# Patient Record
Sex: Female | Born: 1990 | Race: White | Hispanic: No | Marital: Single | Smoking: Current every day smoker
Health system: Southern US, Community
[De-identification: ages and names within clinical notes are randomized; demographics above are authoritative.]

## PROBLEM LIST (undated history)

## (undated) DIAGNOSIS — F419 Anxiety disorder, unspecified: Secondary | ICD-10-CM

## (undated) DIAGNOSIS — F329 Major depressive disorder, single episode, unspecified: Secondary | ICD-10-CM

## (undated) DIAGNOSIS — F32A Depression, unspecified: Secondary | ICD-10-CM

## (undated) DIAGNOSIS — F1120 Opioid dependence, uncomplicated: Secondary | ICD-10-CM

## (undated) HISTORY — PX: TONSILLECTOMY: SUR1361

## (undated) HISTORY — PX: TYMPANOSTOMY TUBE PLACEMENT: SHX32

## (undated) HISTORY — DX: Major depressive disorder, single episode, unspecified: F32.9

## (undated) HISTORY — DX: Anxiety disorder, unspecified: F41.9

## (undated) HISTORY — DX: Depression, unspecified: F32.A

## (undated) HISTORY — DX: Opioid dependence, uncomplicated: F11.20

---
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
## 2015-04-29 ENCOUNTER — Inpatient Hospital Stay (HOSPITAL_COMMUNITY)
Admission: AD | Admit: 2015-04-29 | Discharge: 2015-04-29 | Disposition: A | Discharge status: Home / Self Care | Payer: BLUE CROSS/BLUE SHIELD | Source: Ambulatory Visit | Attending: Obstetrics and Gynecology | Admitting: Obstetrics and Gynecology

## 2015-04-29 DIAGNOSIS — Z3201 Encounter for pregnancy test, result positive: Secondary | ICD-10-CM | POA: Insufficient documentation

## 2015-04-29 DIAGNOSIS — F1120 Opioid dependence, uncomplicated: Secondary | ICD-10-CM

## 2015-04-29 DIAGNOSIS — Z3200 Encounter for pregnancy test, result unknown: Secondary | ICD-10-CM | POA: Diagnosis present

## 2015-04-29 LAB — URINALYSIS, ROUTINE W REFLEX MICROSCOPIC
BILIRUBIN URINE: NEGATIVE
Glucose, UA: NEGATIVE mg/dL
HGB URINE DIPSTICK: NEGATIVE
KETONES UR: NEGATIVE mg/dL
Leukocytes, UA: NEGATIVE
NITRITE: NEGATIVE
PH: 7.5 (ref 5.0–8.0)
Protein, ur: NEGATIVE mg/dL
Specific Gravity, Urine: 1.015 (ref 1.005–1.030)
UROBILINOGEN UA: 0.2 mg/dL (ref 0.0–1.0)

## 2015-04-29 LAB — POCT PREGNANCY, URINE: Preg Test, Ur: POSITIVE — AB

## 2015-04-29 NOTE — MAU Note (Signed)
Pt states about a year ago she was put on Subutex for opiate addiction, stopped it in January. Pt reports she has been using opiates again for the last 1.5 months and wants help to stop drugs.

## 2015-04-29 NOTE — MAU Provider Note (Signed)
  History     CSN: [id number]  Arrival date and time: 04/29/15 [id number]   First Provider Initiated Contact with Patient 04/29/15 0335      No chief complaint on file.  HPI  Theresa Curtis is a 24 y.o. G1P0 at unknown EGA. She presents today for pregnancy verification, and questions related to opiate dependence. She states that she had a +UPT at home about two weeks ago. She denies any abdominal pain or vaginal bleeding at this time. She is uncertain of her LMP and states that she always has very irregular periods. She would like to know how far along she is today. She also has specific questions related to opiate withdrawal in early pregnancy. She states that she was on Suboxone, but stopped it in January. About two months ago she started using opiate pain medications again. She states that she has been using "oxys and hydros". She states that "she is not using very much". She would like to stop at this time, but does not wish to seek counseling or going back on suboxone. [facility]   No past medical history on file.  No past surgical history on file.  No family history on file.  History  Substance Use Topics  . Smoking status: Not on file  . Smokeless tobacco: Not on file  . Alcohol Use: Not on file    Allergies: Allergies not on file  No prescriptions prior to admission    Review of Systems  Gastrointestinal: Negative for nausea, vomiting, abdominal pain, diarrhea and constipation.   Physical Exam   Blood pressure 138/72, pulse 124, temperature 99.4 F (37.4 C), temperature source Oral, resp. rate 16, height 5\' 4"  (1.626 m), weight 95.709 kg (211 lb), SpO2 100 %.  Physical Exam  Nursing note and vitals reviewed. Constitutional: She is oriented to person, place, and time. She appears well-developed and well-nourished. No distress.  HENT:  Head: Normocephalic.  Respiratory: Effort normal.  GI: Soft.  Neurological: She  is alert and oriented to person, place, and time.  Skin: Skin is warm and dry.  Psychiatric: She has a normal mood and affect.    MAU Course  Procedures  MDM D/W the patient at length about opiate withdrawal. Questions answered. She states that she would like an US today to know how far along she is. Explained that we don't typically do dating US in ER. Offered to schedule patient for outpatient US. She declines that at this time. She would prefer to wait until she has her new OB appointment. Offered information on NA and other counseling.   Assessment and Plan   1. Encounter for pregnancy test, result positive   2. Uncomplicated opioid dependence     **Patient does not want to have any of this information discussed with any family members present**    DC home Comfort measures reviewed  1st Trimester precautions  RX: none  Return to MAU as needed FU with OB as planned  Follow-up Information    Follow up with Almon HerculesOSS,KENDRA H., MD.   Specialty:  Obstetrics and Gynecology   Why:  As scheduled   Contact information:   [address] [place] [address] [phone]         Tawnya CrookHogan, Christobal Morado Donovan 04/29/2015, 3:41 AM

## 2015-04-29 NOTE — Discharge Instructions (Signed)
Opioid Use Disorder °Opioid use disorder is a mental disorder. It is the continued nonmedical use of opioids in spite of risks to health and well-being. Misused opioids include the street drug heroin. They also include pain medicines such as morphine, hydrocodone, oxycodone, and fentanyl. Opioids are very addictive. People who misuse opioids get an exaggerated feeling of well-being. Opioid use disorder often disrupts activities at home, work, or school. It may cause mental or physical problems.  °A family history of opioid use disorder puts you at higher risk of it. People with opioid use disorder often misuse other drugs or have mental illness such as depression, posttraumatic stress disorder, or antisocial personality disorder. They also are at risk of suicide and death from overdose. °SIGNS AND SYMPTOMS  °Signs and symptoms of opioid use disorder include: °· Use of opioids in larger amounts or over a longer period than intended. °· Unsuccessful attempts to cut down or control opioid use. °· A lot of time spent obtaining, using, or recovering from the effects of opioids. °· A strong desire or urge to use opioids (craving). °· Continued use of opioids in spite of major problems at work, school, or home because of use. °· Continued use of opioids in spite of relationship problems because of use. °· Giving up or cutting down on important life activities because of opioid use. °· Use of opioids over and over in situations when it is physically hazardous, such as driving a car. °· Continued use of opioids in spite of a physical problem that is likely related to use. Physical problems can include: °¨ Severe constipation. °¨ Poor nutrition. °¨ Infertility. °¨ Tuberculosis. °¨ Aspiration pneumonia. °¨ Infections such as human immunodeficiency virus (HIV) and hepatitis (from injecting opioids). °· Continued use of opioids in spite of a mental problem that is likely related to use. Mental problems can  include: °¨ Depression. °¨ Anxiety. °¨ Hallucinations. °¨ Sleep problems. °¨ Loss of sexual function. °· Need to use more and more opioids to get the same effect, or lessened effect over time with use of the same amount (tolerance). °· Having withdrawal symptoms when opioid use is stopped, or using opioids to reduce or avoid withdrawal symptoms. Withdrawal symptoms include: °¨ Depressed, anxious, or irritable mood. °¨ Nausea, vomiting, diarrhea, or intestinal cramping. °¨ Muscle aches or spasms. °¨ Excessive tearing or runny nose. °¨ Dilated pupils, sweating, or hairs standing on end. °¨ Yawning. °¨ Fever, raised blood pressure, or fast pulse. °¨ Restlessness or trouble sleeping. This does not apply to people taking opioids for medical reasons only. °DIAGNOSIS °Opioid use disorder is diagnosed by your health care provider. You may be asked questions about your opioid use and and how it affects your life. A physical exam may be done. A drug screen may be ordered. You may be referred to a mental health professional. The diagnosis of opioid use disorder requires at least two symptoms within 12 months. The type of opioid use disorder you have depends on the number of signs and symptoms you have. The type may be: °· Mild. Two or three signs and symptoms.    °· Moderate. Four or five signs and symptoms.   °· Severe. Six or more signs and symptoms. °TREATMENT  °Treatment is usually provided by mental health professionals with training in substance use disorders. The following options are available: °· Detoxification. This is the first step in treatment for withdrawal. It is medically supervised withdrawal with the use of medicines. These medicines lessen withdrawal symptoms. They also raise the chance   of becoming opioid free. °· Counseling, also known as talk therapy. Talk therapy addresses the reasons you use opioids. It also addresses ways to keep you from using again (relapse). The goals of talk therapy are to avoid  relapse by: °¨ Identifying and avoiding triggers for use. °¨ Finding healthy ways to cope with stress. °¨ Learning how to handle cravings. °· Support groups. Support groups provide emotional support, advice, and guidance. °· A medicine that blocks opioid receptors in your brain. This medicine can reduce opioid cravings that lead to relapse. This medicine also blocks the desired opioid effect when relapse occurs. °· Opioids that are taken by mouth in place of the misused opioid (opioid maintenance treatment). These medicines satisfy cravings but are safer than commonly misused opioids. This often is the best option for people who continue to relapse with other treatments. °HOME CARE INSTRUCTIONS  °· Take medicines only as directed by your health care provider. °· Check with your health care provider before starting new medicines. °· Keep all follow-up visits as directed by your health care provider. °SEEK MEDICAL CARE IF: °· You are not able to take your medicines as directed. °· Your symptoms get worse. °SEEK IMMEDIATE MEDICAL CARE IF: °· You have serious thoughts about hurting yourself or others. °· You may have taken an overdose of opioids. °FOR MORE INFORMATION °· National Institute on Drug Abuse: www.drugabuse.gov °· Substance Abuse and Mental Health Services Administration: www.samhsa.gov °Document Released: 07/30/2007 Document Revised: 02/16/2014 Document Reviewed: 10/15/2013 °ExitCare® Patient Information ©2015 [employer]. This information is not intended to replace advice given to you by your health care provider. Make sure you discuss any questions you have with your health care provider. ° °

## 2015-05-28 LAB — OB RESULTS CONSOLE ABO/RH: RH Type: POSITIVE

## 2015-05-28 LAB — OB RESULTS CONSOLE RUBELLA ANTIBODY, IGM: Rubella: IMMUNE

## 2015-05-28 LAB — OB RESULTS CONSOLE HIV ANTIBODY (ROUTINE TESTING): HIV: NONREACTIVE

## 2015-05-28 LAB — OB RESULTS CONSOLE HEPATITIS B SURFACE ANTIGEN: Hepatitis B Surface Ag: NEGATIVE

## 2015-05-28 LAB — OB RESULTS CONSOLE ANTIBODY SCREEN: Antibody Screen: NEGATIVE

## 2015-05-28 LAB — OB RESULTS CONSOLE GC/CHLAMYDIA
CHLAMYDIA, DNA PROBE: NEGATIVE
Gonorrhea: NEGATIVE

## 2015-05-28 LAB — OB RESULTS CONSOLE RPR: RPR: NONREACTIVE

## 2015-12-24 ENCOUNTER — Telehealth (HOSPITAL_COMMUNITY): Payer: Self-pay | Admitting: *Deleted

## 2015-12-24 ENCOUNTER — Encounter (HOSPITAL_COMMUNITY): Payer: Self-pay | Admitting: *Deleted

## 2015-12-24 LAB — OB RESULTS CONSOLE GBS: STREP GROUP B AG: NEGATIVE

## 2015-12-24 NOTE — Telephone Encounter (Signed)
Preadmission screen  

## 2015-12-27 ENCOUNTER — Encounter (HOSPITAL_COMMUNITY): Payer: Self-pay | Admitting: *Deleted

## 2015-12-27 ENCOUNTER — Inpatient Hospital Stay (HOSPITAL_COMMUNITY): Payer: BLUE CROSS/BLUE SHIELD | Admitting: Anesthesiology

## 2015-12-27 ENCOUNTER — Inpatient Hospital Stay (HOSPITAL_COMMUNITY)
Admission: AD | Admit: 2015-12-27 | Discharge: 2015-12-29 | DRG: 775 | Disposition: A | Discharge status: Home / Self Care | Payer: BLUE CROSS/BLUE SHIELD | Source: Ambulatory Visit | Attending: Obstetrics and Gynecology | Admitting: Obstetrics and Gynecology

## 2015-12-27 ENCOUNTER — Inpatient Hospital Stay (HOSPITAL_COMMUNITY): Admission: RE | Admit: 2015-12-27 | Payer: BLUE CROSS/BLUE SHIELD | Source: Ambulatory Visit

## 2015-12-27 DIAGNOSIS — Z833 Family history of diabetes mellitus: Secondary | ICD-10-CM

## 2015-12-27 DIAGNOSIS — F17210 Nicotine dependence, cigarettes, uncomplicated: Secondary | ICD-10-CM | POA: Diagnosis present

## 2015-12-27 DIAGNOSIS — O99214 Obesity complicating childbirth: Secondary | ICD-10-CM | POA: Diagnosis present

## 2015-12-27 DIAGNOSIS — O4292 Full-term premature rupture of membranes, unspecified as to length of time between rupture and onset of labor: Secondary | ICD-10-CM | POA: Diagnosis present

## 2015-12-27 DIAGNOSIS — Z6841 Body Mass Index (BMI) 40.0 and over, adult: Secondary | ICD-10-CM

## 2015-12-27 DIAGNOSIS — O99334 Smoking (tobacco) complicating childbirth: Secondary | ICD-10-CM | POA: Diagnosis present

## 2015-12-27 DIAGNOSIS — Z8249 Family history of ischemic heart disease and other diseases of the circulatory system: Secondary | ICD-10-CM | POA: Diagnosis not present

## 2015-12-27 DIAGNOSIS — Z3A39 39 weeks gestation of pregnancy: Secondary | ICD-10-CM

## 2015-12-27 DIAGNOSIS — Z823 Family history of stroke: Secondary | ICD-10-CM | POA: Diagnosis not present

## 2015-12-27 DIAGNOSIS — Z3490 Encounter for supervision of normal pregnancy, unspecified, unspecified trimester: Secondary | ICD-10-CM

## 2015-12-27 LAB — URINALYSIS, ROUTINE W REFLEX MICROSCOPIC
Bilirubin Urine: NEGATIVE
GLUCOSE, UA: NEGATIVE mg/dL
HGB URINE DIPSTICK: NEGATIVE
Ketones, ur: NEGATIVE mg/dL
LEUKOCYTES UA: NEGATIVE
Nitrite: NEGATIVE
PH: 6 (ref 5.0–8.0)
PROTEIN: NEGATIVE mg/dL
SPECIFIC GRAVITY, URINE: 1.015 (ref 1.005–1.030)

## 2015-12-27 LAB — CBC
HEMATOCRIT: 37.1 % (ref 36.0–46.0)
HEMOGLOBIN: 12.8 g/dL (ref 12.0–15.0)
MCH: 29.6 pg (ref 26.0–34.0)
MCHC: 34.5 g/dL (ref 30.0–36.0)
MCV: 85.7 fL (ref 78.0–100.0)
Platelets: 226 10*3/uL (ref 150–400)
RBC: 4.33 MIL/uL (ref 3.87–5.11)
RDW: 15.1 % (ref 11.5–15.5)
WBC: 15.1 10*3/uL — ABNORMAL HIGH (ref 4.0–10.5)

## 2015-12-27 LAB — RPR: RPR Ser Ql: NONREACTIVE

## 2015-12-27 LAB — TYPE AND SCREEN
ABO/RH(D): A POS
Antibody Screen: NEGATIVE

## 2015-12-27 LAB — ABO/RH: ABO/RH(D): A POS

## 2015-12-27 LAB — POCT FERN TEST: POCT Fern Test: POSITIVE

## 2015-12-27 MED ORDER — MEASLES, MUMPS & RUBELLA VAC ~~LOC~~ INJ
0.5000 mL | INJECTION | Freq: Once | SUBCUTANEOUS | Status: DC
  Filled 2015-12-27: qty 0.5

## 2015-12-27 MED ORDER — SENNOSIDES-DOCUSATE SODIUM 8.6-50 MG PO TABS
2.0000 | ORAL_TABLET | ORAL | Status: DC
  Administered 2015-12-27 – 2015-12-28 (×2): 2 via ORAL
  Filled 2015-12-27 (×2): qty 2

## 2015-12-27 MED ORDER — OXYTOCIN 10 UNIT/ML IJ SOLN
1.0000 m[IU]/min | INTRAVENOUS | Status: DC
  Administered 2015-12-27: 1 m[IU]/min via INTRAVENOUS
  Administered 2015-12-27: 4 m[IU]/min via INTRAVENOUS
  Filled 2015-12-27: qty 10

## 2015-12-27 MED ORDER — EPHEDRINE 5 MG/ML INJ
10.0000 mg | INTRAVENOUS | Status: DC | PRN
  Filled 2015-12-27: qty 2

## 2015-12-27 MED ORDER — PHENYLEPHRINE 40 MCG/ML (10ML) SYRINGE FOR IV PUSH (FOR BLOOD PRESSURE SUPPORT)
80.0000 ug | PREFILLED_SYRINGE | INTRAVENOUS | Status: DC | PRN
  Filled 2015-12-27: qty 2
  Filled 2015-12-27: qty 20

## 2015-12-27 MED ORDER — LIDOCAINE HCL (PF) 1 % IJ SOLN
INTRAMUSCULAR | Status: DC | PRN
  Administered 2015-12-27: 3 mL via EPIDURAL
  Administered 2015-12-27: 5 mL via EPIDURAL
  Administered 2015-12-27: 2 mL via EPIDURAL

## 2015-12-27 MED ORDER — WITCH HAZEL-GLYCERIN EX PADS
1.0000 "application " | MEDICATED_PAD | CUTANEOUS | Status: DC | PRN
  Administered 2015-12-28: 1 via TOPICAL

## 2015-12-27 MED ORDER — SIMETHICONE 80 MG PO CHEW: 80.0000 mg | CHEWABLE_TABLET | ORAL | Status: DC | PRN

## 2015-12-27 MED ORDER — ONDANSETRON HCL 4 MG/2ML IJ SOLN: 4.0000 mg | Freq: Four times a day (QID) | INTRAMUSCULAR | Status: DC | PRN

## 2015-12-27 MED ORDER — ONDANSETRON HCL 4 MG PO TABS: 4.0000 mg | ORAL_TABLET | ORAL | Status: DC | PRN

## 2015-12-27 MED ORDER — TETANUS-DIPHTH-ACELL PERTUSSIS 5-2.5-18.5 LF-MCG/0.5 IM SUSP
0.5000 mL | Freq: Once | INTRAMUSCULAR | Status: DC
Start: 2015-12-28 — End: 2015-12-28

## 2015-12-27 MED ORDER — LACTATED RINGERS IV SOLN: 500.0000 mL | Freq: Once | INTRAVENOUS | Status: DC

## 2015-12-27 MED ORDER — LIDOCAINE HCL (PF) 1 % IJ SOLN
30.0000 mL | INTRAMUSCULAR | Status: DC | PRN
  Filled 2015-12-27: qty 30

## 2015-12-27 MED ORDER — FLEET ENEMA 7-19 GM/118ML RE ENEM: 1.0000 | ENEMA | RECTAL | Status: DC | PRN

## 2015-12-27 MED ORDER — OXYCODONE-ACETAMINOPHEN 5-325 MG PO TABS
1.0000 | ORAL_TABLET | ORAL | Status: DC | PRN
  Administered 2015-12-28 (×2): 1 via ORAL
  Filled 2015-12-27 (×2): qty 1

## 2015-12-27 MED ORDER — TERBUTALINE SULFATE 1 MG/ML IJ SOLN
0.2500 mg | Freq: Once | INTRAMUSCULAR | Status: DC | PRN
  Filled 2015-12-27: qty 1

## 2015-12-27 MED ORDER — DIPHENHYDRAMINE HCL 50 MG/ML IJ SOLN
12.5000 mg | INTRAMUSCULAR | Status: DC | PRN
Start: 2015-12-27 — End: 2015-12-27

## 2015-12-27 MED ORDER — ACETAMINOPHEN 325 MG PO TABS: 650.0000 mg | ORAL_TABLET | ORAL | Status: DC | PRN

## 2015-12-27 MED ORDER — IBUPROFEN 600 MG PO TABS
600.0000 mg | ORAL_TABLET | Freq: Four times a day (QID) | ORAL | Status: DC
Start: 2015-12-27 — End: 2015-12-29
  Administered 2015-12-27 – 2015-12-29 (×7): 600 mg via ORAL
  Filled 2015-12-27 (×8): qty 1

## 2015-12-27 MED ORDER — OXYCODONE-ACETAMINOPHEN 5-325 MG PO TABS
1.0000 | ORAL_TABLET | ORAL | Status: DC | PRN
  Administered 2015-12-27: 1 via ORAL
  Filled 2015-12-27: qty 1

## 2015-12-27 MED ORDER — PRENATAL MULTIVITAMIN CH
1.0000 | ORAL_TABLET | Freq: Every day | ORAL | Status: DC
  Administered 2015-12-28 – 2015-12-29 (×2): 1 via ORAL
  Filled 2015-12-27 (×2): qty 1

## 2015-12-27 MED ORDER — MEDROXYPROGESTERONE ACETATE 150 MG/ML IM SUSP: 150.0000 mg | INTRAMUSCULAR | Status: DC | PRN

## 2015-12-27 MED ORDER — PHENYLEPHRINE 40 MCG/ML (10ML) SYRINGE FOR IV PUSH (FOR BLOOD PRESSURE SUPPORT)
80.0000 ug | PREFILLED_SYRINGE | INTRAVENOUS | Status: DC | PRN
  Filled 2015-12-27: qty 2

## 2015-12-27 MED ORDER — FENTANYL 2.5 MCG/ML BUPIVACAINE 1/10 % EPIDURAL INFUSION (WH - ANES)
14.0000 mL/h | INTRAMUSCULAR | Status: DC | PRN
  Administered 2015-12-27 (×2): 14 mL/h via EPIDURAL
  Filled 2015-12-27 (×2): qty 125

## 2015-12-27 MED ORDER — LACTATED RINGERS IV SOLN
500.0000 mL | INTRAVENOUS | Status: DC | PRN
  Administered 2015-12-27: 500 mL via INTRAVENOUS

## 2015-12-27 MED ORDER — DIPHENHYDRAMINE HCL 25 MG PO CAPS: 25.0000 mg | ORAL_CAPSULE | Freq: Four times a day (QID) | ORAL | Status: DC | PRN

## 2015-12-27 MED ORDER — OXYCODONE-ACETAMINOPHEN 5-325 MG PO TABS: 2.0000 | ORAL_TABLET | ORAL | Status: DC | PRN

## 2015-12-27 MED ORDER — BENZOCAINE-MENTHOL 20-0.5 % EX AERO
1.0000 "application " | INHALATION_SPRAY | CUTANEOUS | Status: DC | PRN
  Administered 2015-12-27: 1 via TOPICAL
  Filled 2015-12-27: qty 56

## 2015-12-27 MED ORDER — ONDANSETRON HCL 4 MG/2ML IJ SOLN: 4.0000 mg | INTRAMUSCULAR | Status: DC | PRN

## 2015-12-27 MED ORDER — OXYTOCIN BOLUS FROM INFUSION: 500.0000 mL | INTRAVENOUS | Status: DC

## 2015-12-27 MED ORDER — CITRIC ACID-SODIUM CITRATE 334-500 MG/5ML PO SOLN: 30.0000 mL | ORAL | Status: DC | PRN

## 2015-12-27 MED ORDER — OXYTOCIN 10 UNIT/ML IJ SOLN
2.5000 [IU]/h | INTRAVENOUS | Status: DC
  Administered 2015-12-27: 15:00:00 via INTRAVENOUS

## 2015-12-27 MED ORDER — ACETAMINOPHEN 325 MG PO TABS
650.0000 mg | ORAL_TABLET | ORAL | Status: DC | PRN
  Filled 2015-12-27: qty 2

## 2015-12-27 MED ORDER — LACTATED RINGERS IV SOLN
INTRAVENOUS | Status: DC
  Administered 2015-12-27: 125 mL/h via INTRAVENOUS
  Administered 2015-12-27: 01:00:00 via INTRAVENOUS

## 2015-12-27 MED ORDER — ZOLPIDEM TARTRATE 5 MG PO TABS: 5.0000 mg | ORAL_TABLET | Freq: Every evening | ORAL | Status: DC | PRN

## 2015-12-27 MED ORDER — DIBUCAINE 1 % RE OINT
1.0000 "application " | TOPICAL_OINTMENT | RECTAL | Status: DC | PRN
  Administered 2015-12-28: 1 via RECTAL
  Filled 2015-12-27: qty 28

## 2015-12-27 MED ORDER — LANOLIN HYDROUS EX OINT
TOPICAL_OINTMENT | CUTANEOUS | Status: DC | PRN
Start: 2015-12-27 — End: 2015-12-29

## 2015-12-27 NOTE — Lactation Note (Signed)
This note was copied from a baby's chart. Lactation Consultation Note  Patient Name: Theresa Curtis ZOXWR'UToday's Date: 12/27/2015 Reason for consult: Initial assessment Baby at 5 hr of life and mom is worried that baby has been sleeping since birth. Before entry baby was latched and mom stated she had "several good sucks". Baby seemed content. LC tried to help mom re latch but baby was not interested. Placed baby sts and encouraged mom to offer the breast again in 2 hr unless baby cued before then. Demonstrated manual expression, colostrum noted bilaterally, spoon in room. Discussed baby behavior, feeding frequency, baby belly size, voids, wt loss, breast changes, and nipple care. Given lactation handouts. Aware of OP services and support group.    Maternal Data Has patient been taught Hand Expression?: Yes Does the patient have breastfeeding experience prior to this delivery?: No  Feeding Feeding Type: Breast Fed Length of feed: 0 min  LATCH Score/Interventions Latch: Too sleepy or reluctant, no latch achieved, no sucking elicited. Intervention(s): Skin to skin;Teach feeding cues;Waking techniques  Audible Swallowing: None Intervention(s): Skin to skin;Hand expression  Type of Nipple: Everted at rest and after stimulation  Comfort (Breast/Nipple): Soft / non-tender     Hold (Positioning): Assistance needed to correctly position infant at breast and maintain latch. Intervention(s): Support Pillows;Position options  LATCH Score: 5  Lactation Tools Discussed/Used WIC Program: Yes   Consult Status Consult Status: Follow-up Date: 12/28/15 Follow-up type: In-patient    Theresa Curtis 12/27/2015, 8:19 PM

## 2015-12-27 NOTE — MAU Note (Signed)
Pt states that she noticed a small trickle of fluid about an hour ago. Denies vaginal bleeding. Some contractions. +FM. Is scheduled for induction in morning for some mildly elevated BP's.

## 2015-12-27 NOTE — Progress Notes (Signed)
Discussed with anesthesia pt's pain scores and interventions.  Anesthesia MD would like pt to receive another PCEA

## 2015-12-27 NOTE — H&P (Signed)
Theresa Curtis is a 25 y.o. female G1 @ 39weeks presenting for SROM/labor.   History OB History    Gravida Para Term Preterm AB TAB SAB Ectopic Multiple Living   1              Past Medical History  Diagnosis Date  . Anxiety   . Depression   . Opiate addiction [facility](HCC)    Past Surgical History  Procedure Laterality Date  . Tonsillectomy    . Tympanostomy tube placement     Family History: family history includes Diabetes in her father; Hypertension in her mother; Kidney disease in her maternal grandfather and maternal grandmother; Seizures in her mother; Stroke in her maternal grandmother; Thyroid disease in her father and mother. Social History:  reports that she has been smoking.  She does not have any smokeless tobacco history on file. Her alcohol and drug histories are not on file.   Prenatal Transfer Tool  Maternal Diabetes: No Genetic Screening: Normal Maternal Ultrasounds/Referrals: Normal Fetal Ultrasounds or other Referrals:  None Maternal Substance Abuse:  Yes:  Type: Smoker, Prescription drugs Significant Maternal Medications:  None Significant Maternal Lab Results:  None Other Comments:  None  ROS  Dilation: 5 Effacement (%): 90 Station: 0 Exam by:: S Grindstaff rn Blood pressure 138/82, pulse 90, temperature 97.5 F (36.4 C), temperature source Oral, resp. rate 20, height 5' 3.5" (1.613 m), weight 258 lb 9.6 oz (117.3 kg), SpO2 98 %. Exam Physical Exam  Gen - NAD Abd - gravid, NT EFW 6.5# Ext - NT, 1+ edema Prenatal labs: ABO, Rh: --/--/A POS, A POS (03/13 0055) Antibody: NEG (03/13 0055) Rubella: Immune (08/12 0000) RPR: Nonreactive (08/12 0000)  HBsAg: Negative (08/12 0000)  HIV: Non-reactive (08/12 0000)  GBS: Negative (03/10 0000)   Assessment/Plan: Admit Epidural prn Pitocin augmentation   Rhydian Baldi 12/27/2015, 9:10 AM

## 2015-12-27 NOTE — Progress Notes (Signed)
Pt pushing x 1 hour, good effort.  epidural  FHT reassuring Toco Q2 Fetal vertex @ 0 station  A/P:  Continue pushing

## 2015-12-27 NOTE — Progress Notes (Signed)
Called for delivery, arrived in room < 3 min later and head delivered by RN @ perineum Shoulders and body delivered easily with next push & infant placed on maternal chest Cord clamped and cut @ 1 minute. Placenta delivered spontaneous w/ 3VC 2nd degree lac repaired with 3-0 vicryl rapide EBL 200cc  Apgars 9,9 Mom and baby stable in LDR

## 2015-12-27 NOTE — Anesthesia Procedure Notes (Signed)
Epidural Patient location during procedure: OB  Staffing Anesthesiologist: Melesa Lecy EDWARD Performed by: anesthesiologist   Preanesthetic Checklist Completed: patient identified, pre-op evaluation, timeout performed, IV checked, risks and benefits discussed and monitors and equipment checked  Epidural Patient position: sitting Prep: DuraPrep Patient monitoring: blood pressure and continuous pulse ox Approach: midline Location: L3-L4 Injection technique: LOR air  Needle:  Needle type: Tuohy  Needle gauge: 17 G Needle length: 9 cm Needle insertion depth: 8 cm Catheter size: 19 Gauge Catheter at skin depth: 13 cm Test dose: negative and Other (1% Lidocaine)  Additional Notes Patient identified.  Risk benefits discussed including failed block, incomplete pain control, headache, nerve damage, paralysis, blood pressure changes, nausea, vomiting, reactions to medication both toxic or allergic, and postpartum back pain.  Patient expressed understanding and wished to proceed.  All questions were answered.  Sterile technique used throughout procedure and epidural site dressed with sterile barrier dressing. No paresthesia or other complications noted. The patient did not experience any signs of intravascular injection such as tinnitus or metallic taste in mouth nor signs of intrathecal spread such as rapid motor block. Please see nursing notes for vital signs. Reason for block:procedure for pain   

## 2015-12-27 NOTE — Anesthesia Preprocedure Evaluation (Addendum)
Anesthesia Evaluation  Patient identified by MRN, date of birth, ID band Patient awake    Reviewed: Allergy & Precautions, NPO status , Patient's Chart, lab work & pertinent test results  Airway Mallampati: II  TM Distance: >3 FB Neck ROM: Full    Dental  (+) Teeth Intact, Dental Advisory Given   Pulmonary Current Smoker,    Pulmonary exam normal breath sounds clear to auscultation       Cardiovascular negative cardio ROS Normal cardiovascular exam Rhythm:Regular Rate:Normal     Neuro/Psych PSYCHIATRIC DISORDERS Anxiety Depression negative neurological ROS     GI/Hepatic negative GI ROS, (+)     substance abuse (narcotics prior to pregnancy, history of suboxone use)  ,   Endo/Other  Morbid obesity  Renal/GU negative Renal ROS     Musculoskeletal negative musculoskeletal ROS (+)   Abdominal   Peds  Hematology negative hematology ROS (+) Plt 226k    Anesthesia Other Findings Day of surgery medications reviewed with the patient.  Reproductive/Obstetrics (+) Pregnancy                            Anesthesia Physical Anesthesia Plan  ASA: III  Anesthesia Plan: Epidural   Post-op Pain Management:    Induction:   Airway Management Planned:   Additional Equipment:   Intra-op Plan:   Post-operative Plan:   Informed Consent: I have reviewed the patients History and Physical, chart, labs and discussed the procedure including the risks, benefits and alternatives for the proposed anesthesia with the patient or authorized representative who has indicated his/her understanding and acceptance.   Dental advisory given  Plan Discussed with:   Anesthesia Plan Comments: (Patient identified. Risks/Benefits/Options discussed with patient including but not limited to bleeding, infection, nerve damage, paralysis, failed block, incomplete pain control, headache, blood pressure changes, nausea,  vomiting, reactions to medication both or allergic, itching and postpartum back pain. Confirmed with bedside nurse the patient's most recent platelet count. Confirmed with patient that they are not currently taking any anticoagulation, have any bleeding history or any family history of bleeding disorders. Patient expressed understanding and wished to proceed. All questions were answered. )        Anesthesia Quick Evaluation

## 2015-12-28 LAB — CBC
HCT: 31.9 % — ABNORMAL LOW (ref 36.0–46.0)
HEMOGLOBIN: 10.8 g/dL — AB (ref 12.0–15.0)
MCH: 29.6 pg (ref 26.0–34.0)
MCHC: 33.9 g/dL (ref 30.0–36.0)
MCV: 87.4 fL (ref 78.0–100.0)
PLATELETS: 199 10*3/uL (ref 150–400)
RBC: 3.65 MIL/uL — AB (ref 3.87–5.11)
RDW: 15.3 % (ref 11.5–15.5)
WBC: 15.4 10*3/uL — AB (ref 4.0–10.5)

## 2015-12-28 MED ORDER — TETANUS-DIPHTH-ACELL PERTUSSIS 5-2.5-18.5 LF-MCG/0.5 IM SUSP
0.5000 mL | Freq: Once | INTRAMUSCULAR | Status: AC
  Administered 2015-12-28: 0.5 mL via INTRAMUSCULAR
  Filled 2015-12-28: qty 0.5

## 2015-12-28 NOTE — Progress Notes (Signed)
CSW received request for consult due to history of anxiety, depression, and opiate abuse.   Just prior to meeting with MOB, CSW informed by RN that numerous visitors have arrived.   CSW to follow up with MOB to complete assessment prior to discharge.  Theresa Curtis MSW, LCSW [phone] 

## 2015-12-28 NOTE — Progress Notes (Signed)
Post Partum Day 1 Subjective: no complaints, up ad lib, voiding and tolerating PO  Objective: Blood pressure 125/46, pulse 98, temperature 98.1 F (36.7 C), temperature source Oral, resp. rate 20, height 5' 3.5" (1.613 m), weight 258 lb 9.6 oz (117.3 kg), SpO2 97 %, unknown if currently breastfeeding.  Physical Exam:  General: alert and cooperative Lochia: appropriate Uterine Fundus: firm Incision: healing well DVT Evaluation: No evidence of DVT seen on physical exam. Negative Homan's sign. No cords or calf tenderness.   Recent Labs  12/27/15 0055 12/28/15 0531  HGB 12.8 10.8*  HCT 37.1 31.9*    Assessment/Plan: Plan for discharge tomorrow   LOS: 1 day   CURTIS,CAROL G 12/28/2015, 8:11 AM

## 2015-12-28 NOTE — Lactation Note (Signed)
This note was copied from a baby's chart. Lactation Consultation Note: Assist mother with latching infant in football hold.  Infant latched using off sided latch. Observed good depth.  Mother taught breast compression. Observed burst of suckles and swallows. Mother receptive to all teaching. Infant sustained latch for 15 mins.  Mother advised to continue to cue base feed infant. Observed that mothers nipple round when infant released the breast. Mother is pleased with infants feeding.   Patient Name: Theresa Curtis NWGNF'AToday's Date: 12/28/2015 Reason for consult: Follow-up assessment   Maternal Data    Feeding Feeding Type: Breast Fed Length of feed: 10 min (off and on)  LATCH Score/Interventions Latch: Grasps breast easily, tongue down, lips flanged, rhythmical sucking. Intervention(s): Adjust position;Assist with latch;Breast compression  Audible Swallowing: Spontaneous and intermittent  Type of Nipple: Everted at rest and after stimulation  Comfort (Breast/Nipple): Soft / non-tender     Hold (Positioning): Assistance needed to correctly position infant at breast and maintain latch. Intervention(s): Support Pillows;Position options  LATCH Score: 9  Lactation Tools Discussed/Used     Consult Status Consult Status: Follow-up Date: 12/28/15 Follow-up type: In-patient    Stevan BornKendrick, Shanin Szymanowski [facility] 12/28/2015, 3:17 PM

## 2015-12-28 NOTE — Anesthesia Postprocedure Evaluation (Signed)
Anesthesia Post Note  Patient: Theresa Curtis  Procedure(s) Performed: * No procedures listed *  Patient location during evaluation: Mother Baby Anesthesia Type: Epidural Level of consciousness: awake, awake and alert, oriented and patient cooperative Pain management: pain level controlled Vital Signs Assessment: post-procedure vital signs reviewed and stable Respiratory status: spontaneous breathing, nonlabored ventilation and respiratory function stable Cardiovascular status: stable Postop Assessment: no headache, no backache, patient able to bend at knees and no signs of nausea or vomiting Anesthetic complications: no    Last Vitals:  Filed Vitals:   12/27/15 2130 12/28/15 0500  BP: 109/68 125/46  Pulse: 116 98  Temp: 37 C 36.7 C  Resp: 20 20    Last Pain:  Filed Vitals:   12/28/15 0523  PainSc: 2                  Conway Fedora L

## 2015-12-28 NOTE — Lactation Note (Signed)
This note was copied from a baby's chart. Lactation Consultation Note Called for latch assistance. Hand expression demonstrated to show colostrum. Mom has cone shaped breast w/nipple on the end. Mom has baby in football hold, holds breast and baby correctly. Baby fussy not latching, sucking on top lip. Got baby to latch and BF well. Noted red bruise to Lt. Side of head around to back of head. Discusses jaundice and the importance of BF, peeing, and pooping. Encouraged mom to massage breast occasionally during BF. Answered question about BF. Discussed newborn behavior, cluster feeding, supply and demand. Patient Name: Theresa August LuzCourtney Geno VWUJW'JToday's Date: 12/28/2015 Reason for consult: Follow-up assessment;Difficult latch   Maternal Data    Feeding Feeding Type: Breast Fed Length of feed: 15 min (still BF)  LATCH Score/Interventions Latch: Repeated attempts needed to sustain latch, nipple held in mouth throughout feeding, stimulation needed to elicit sucking reflex. Intervention(s): Skin to skin;Teach feeding cues;Waking techniques Intervention(s): Adjust position;Assist with latch;Breast massage;Breast compression  Audible Swallowing: A few with stimulation Intervention(s): Hand expression;Skin to skin Intervention(s): Alternate breast massage  Type of Nipple: Everted at rest and after stimulation  Comfort (Breast/Nipple): Soft / non-tender     Hold (Positioning): Assistance needed to correctly position infant at breast and maintain latch. Intervention(s): Skin to skin;Position options;Support Pillows  LATCH Score: 7  Lactation Tools Discussed/Used     Consult Status Consult Status: Follow-up Date: 12/29/15 Follow-up type: In-patient    Shakyla Nolley, Diamond NickelLAURA G 12/28/2015, 4:02 AM

## 2015-12-29 MED ORDER — OXYCODONE-ACETAMINOPHEN 5-325 MG PO TABS: 1.0000 | ORAL_TABLET | ORAL | Status: DC | PRN

## 2015-12-29 MED ORDER — IBUPROFEN 600 MG PO TABS: 600.0000 mg | ORAL_TABLET | Freq: Four times a day (QID) | ORAL | Status: AC
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
## 2015-12-29 NOTE — Discharge Summary (Signed)
Obstetric Discharge Summary Reason for Admission: rupture of membranes Prenatal Procedures: none Intrapartum Procedures: spontaneous vaginal delivery Postpartum Procedures: none Complications-Operative and Postpartum: 2 degree perineal laceration HEMOGLOBIN  Date Value Ref Range Status  12/28/2015 10.8* 12.0 - 15.0 g/dL Final   HCT  Date Value Ref Range Status  12/28/2015 31.9* 36.0 - 46.0 % Final    Physical Exam:  General: alert and cooperative Lochia: appropriate Uterine Fundus: firm Incision: healing well DVT Evaluation: No evidence of DVT seen on physical exam. Negative Homan's sign. No cords or calf tenderness.  Discharge Diagnoses: Term Pregnancy-delivered  Discharge Information: Date: 12/29/2015 Activity: pelvic rest Diet: routine Medications: PNV, Ibuprofen and Percocet Condition: stable Instructions: refer to practice specific booklet Discharge to: home   Newborn Data: Live born female  Birth Weight: 6 lb 7.5 oz (2935 g) APGAR: 9, 9  Home with mother.  Theresa Curtis G 12/29/2015, 8:19 AM

## 2015-12-29 NOTE — Lactation Note (Signed)
This note was copied from a baby's chart. Lactation Consultation Note  Patient Name: Theresa Curtis ZOXWR'UToday's Date: 12/29/2015 Reason for consult: Follow-up assessment Baby at 44 hr of life and dyad set for d/c. Mom has not been latching baby because her nipples have been sore. She has been pumping and syring feeding. Offered latch help because baby was cueing, mom declined and MGF put a pacifier in the baby's mouth. Encouraged mom to make an OP apt with lactation but she declined, she will f/u with WIC. Discussed using pumping and using paced bottle feeding since baby is not latching but is getting a pacifier. Mom expressed that she does not want to bottle feeding. She will go home and try to latch baby. She will pump if baby is not latching. She will make an apt with bf support today if bf does not go well once she gets home. She is aware of OP services and support group. She requested the volume chart of how much to feed baby based off age. Chart given with instructions that the amounts will increase as baby gets older even though it is not written out on the chart.    Maternal Data    Feeding Feeding Type: Breast Milk  LATCH Score/Interventions                      Lactation Tools Discussed/Used     Consult Status Consult Status: PRN Follow-up type: Out-patient    Theresa Curtis 12/29/2015, 12:10 PM

## 2020-03-17 ENCOUNTER — Other Ambulatory Visit: Payer: Self-pay | Admitting: Family Medicine

## 2020-03-17 DIAGNOSIS — R1011 Right upper quadrant pain: Secondary | ICD-10-CM

## 2020-03-17 DIAGNOSIS — R7401 Elevation of levels of liver transaminase levels: Secondary | ICD-10-CM

## 2020-03-17 DIAGNOSIS — R748 Abnormal levels of other serum enzymes: Secondary | ICD-10-CM

## 2020-03-25 ENCOUNTER — Other Ambulatory Visit: Payer: BLUE CROSS/BLUE SHIELD

## 2020-08-03 ENCOUNTER — Telehealth: Payer: Self-pay

## 2020-08-03 ENCOUNTER — Institutional Professional Consult (permissible substitution): Payer: Medicaid Other | Admitting: Neurology

## 2020-08-03 ENCOUNTER — Encounter: Payer: Self-pay | Admitting: Neurology

## 2020-08-03 NOTE — Telephone Encounter (Signed)
Pt no showed 08/03/2020 appt with Dr. Frances Furbish.

## 2022-05-29 ENCOUNTER — Encounter (HOSPITAL_COMMUNITY): Payer: Self-pay | Admitting: Psychiatry

## 2022-05-29 ENCOUNTER — Telehealth (HOSPITAL_BASED_OUTPATIENT_CLINIC_OR_DEPARTMENT_OTHER): Payer: Medicaid Other | Admitting: Psychiatry

## 2022-05-29 VITALS — Wt 300.0 lb

## 2022-05-29 DIAGNOSIS — F902 Attention-deficit hyperactivity disorder, combined type: Secondary | ICD-10-CM | POA: Diagnosis not present

## 2022-05-29 MED ORDER — LISDEXAMFETAMINE DIMESYLATE 40 MG PO CAPS: 40.0000 mg | ORAL_CAPSULE | Freq: Every day | ORAL | 0 refills | Status: AC
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
## 2022-06-23 ENCOUNTER — Telehealth (HOSPITAL_COMMUNITY): Payer: Medicaid Other | Admitting: Psychiatry
